# Patient Record
Sex: Female | Born: 1995 | Race: Black or African American | Hispanic: No | Marital: Single | Smoking: Never smoker
Health system: Southern US, Community
[De-identification: ages and names within clinical notes are randomized; demographics above are authoritative.]

## PROBLEM LIST (undated history)

## (undated) DIAGNOSIS — K209 Esophagitis, unspecified without bleeding: Secondary | ICD-10-CM

## (undated) DIAGNOSIS — R51 Headache: Secondary | ICD-10-CM

## (undated) DIAGNOSIS — R030 Elevated blood-pressure reading, without diagnosis of hypertension: Secondary | ICD-10-CM

## (undated) DIAGNOSIS — K449 Diaphragmatic hernia without obstruction or gangrene: Secondary | ICD-10-CM

## (undated) DIAGNOSIS — R519 Headache, unspecified: Secondary | ICD-10-CM

## (undated) DIAGNOSIS — Z6841 Body Mass Index (BMI) 40.0 and over, adult: Secondary | ICD-10-CM

## (undated) HISTORY — DX: Esophagitis, unspecified without bleeding: K20.90

## (undated) HISTORY — DX: Body Mass Index (BMI) 40.0 and over, adult: Z684

## (undated) HISTORY — DX: Headache, unspecified: R51.9

## (undated) HISTORY — DX: Elevated blood-pressure reading, without diagnosis of hypertension: R03.0

## (undated) HISTORY — DX: Diaphragmatic hernia without obstruction or gangrene: K44.9

## (undated) HISTORY — DX: Headache: R51

## (undated) HISTORY — DX: Esophagitis, unspecified: K20.9

## (undated) HISTORY — DX: Morbid (severe) obesity due to excess calories: E66.01

## (undated) HISTORY — PX: NO PAST SURGERIES: SHX2092

---
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
## 2014-01-28 ENCOUNTER — Emergency Department: Payer: Self-pay | Admitting: Emergency Medicine

## 2014-01-31 ENCOUNTER — Encounter (HOSPITAL_COMMUNITY): Payer: Self-pay | Admitting: Emergency Medicine

## 2014-01-31 ENCOUNTER — Emergency Department (HOSPITAL_COMMUNITY)
Admission: EM | Admit: 2014-01-31 | Discharge: 2014-02-01 | Disposition: A | Discharge status: Home / Self Care | Payer: 59 | Attending: Emergency Medicine | Admitting: Emergency Medicine

## 2014-01-31 DIAGNOSIS — M542 Cervicalgia: Secondary | ICD-10-CM | POA: Insufficient documentation

## 2014-01-31 DIAGNOSIS — G4489 Other headache syndrome: Secondary | ICD-10-CM | POA: Insufficient documentation

## 2014-01-31 DIAGNOSIS — R0981 Nasal congestion: Secondary | ICD-10-CM | POA: Diagnosis not present

## 2014-01-31 DIAGNOSIS — R51 Headache: Secondary | ICD-10-CM | POA: Diagnosis present

## 2014-01-31 MED ORDER — PSEUDOEPHEDRINE HCL ER 120 MG PO TB12: 120.0000 mg | ORAL_TABLET | Freq: Two times a day (BID) | ORAL | Status: DC

## 2014-01-31 MED ORDER — CYCLOBENZAPRINE HCL 5 MG PO TABS: 5.0000 mg | ORAL_TABLET | Freq: Three times a day (TID) | ORAL | Status: DC | PRN

## 2014-01-31 NOTE — ED Provider Notes (Signed)
CSN: [id number]     Arrival date & time 01/31/14  2136 History  This chart was scribed for a non-physician practitioner, Earley FavorGail Kimberlin Scheel, FNP, working with Ross Marcusourtney Horton, MD by Julian HyMorgan Graham, ED Scribe. The patient was seen in [facility]. The patient's care was started at 11:23 PM.   Chief Complaint  Patient presents with  . Headache    HPI HPI Comments: Beth SeltzerBrittany Morales is a 18 y.o. female who presents to the Emergency Department complaining of new, constant, moderate and gradually worsening headache onset three weeks ago. She describes her pain as a pulling pain. Pt has associated neck pain, sleep disturbance, nausea and sore throat. Pt states she [facility] and was prescribed fioricet with no relief. Pt notes her pain is worsened with ibuprofen. Pt notes she attempted to take Goody powder that caused fatigue.  She does have seasonal allergies and takes zyrtec. Pt denies recently taking Zyrtec. She has attempted warm compresses on her neck with moderate relief. Pt denies past medical history of headaches or migraines. Pt denies nasal congestion, fever, sore throat, vomiting, or sinus pressure.  History reviewed. No pertinent past medical history. History reviewed. No pertinent past surgical history. History reviewed. No pertinent family history. History  Substance Use Topics  . Smoking status: Never Smoker   . Smokeless tobacco: Never Used  . Alcohol Use: No   OB History    No data available     Review of Systems  Constitutional: Negative for fever and chills.  HENT: Positive for congestion. Negative for sinus pressure and sore throat.   Gastrointestinal: Positive for nausea. Negative for vomiting.  Neurological: Positive for dizziness and headaches.   Allergies  Review of patient's allergies indicates no known allergies.  Home Medications   Prior to Admission medications   Not on File   Triage Vitals: BP 148/88 mmHg  Pulse 94  Temp(Src) 98.2 F (36.8 C)  (Oral)  Resp 20  SpO2 100%  LMP 01/10/2014  Physical Exam  ED Course  Procedures (including critical care time) DIAGNOSTIC STUDIES: Oxygen Saturation is 100% on RA, normal by my interpretation.    COORDINATION OF CARE: 11:26 PM- Patient informed of current plan for treatment and evaluation and agrees with plan at this time.    MDM  I think Ms. Beth Morales has a combination of headaches first being sinus headache, most likely from her seasonal allergies, which she has not taken any medication for a while as she has frontal pressure and sounds nasally when she speaks, and tension-type headache that starts in the back of her neck and radiates up to the back of her scalp.  She's been given prescriptions for ibuprofen, Sudafed, and Flexeril and is suggested that she take Zyrtec on a regular basis, which is now over-the-counter Final diagnoses:  None   I personally performed the services described in this documentation, which was scribed in my presence. The recorded information has been reviewed and is accurate.  Arman FilterGail K Jadore Veals, NP 01/31/14 2350  Donnetta HutchingBrian Cook, MD 02/01/14 [phone]

## 2014-01-31 NOTE — ED Notes (Signed)
Pt arrive to the Ed with a complaint of a headache.  Pt was seen and prescribed medicine but it has been ineffective.  Pt states headache is like a "puulling" pain.  Pt has experienced the headache for three weeks

## 2014-01-31 NOTE — Discharge Instructions (Signed)
Sinus Headache A sinus headache happens when your sinuses become clogged or puffy (swollen). Sinus headaches can be mild or severe. HOME CARE  Take your medicines (antibiotics) as told. Finish them even if you start to feel better.  Only take medicine as told by your doctor.  Use a nose spray if you feel stuffed up (congested). GET HELP RIGHT AWAY IF:  You have a fever.  You have trouble seeing.  You suddenly have pain in your face or head.  You start to twitch or shake (seizure).  You are confused.  You get headaches more than once a week.  Light or sound bothers you.  You feel sick to your stomach (nauseous) or throw up (vomit).  Your headaches do not get better with treatment. MAKE SURE YOU:  Understand these instructions.  Will watch your condition.  Will get help right away if you are not doing well or get worse. Document Released: 07/07/2010 Document Revised: 05/30/2011 Document Reviewed: 07/07/2010 [facility] Patient Information 2015 [employer] [place]. This information is not intended to replace advice given to you by your health care provider. Make sure you discuss any questions you have with your health care provider. I think you have a combination of headaches, one being a sinus headache from your seasonal allergies, please take over-the-counter Zyrtec on a regular basis as well as tension headache U been given a prescription for Flexeril to help with your neck and posterior head pain

## 2015-10-13 IMAGING — CT CT HEAD WITHOUT CONTRAST
1 series · 16 of 29 positions shown, 20 images · non-contrast
Comparison: None.

CLINICAL DATA: Headache for 2 weeks, dizziness.

EXAM:
CT HEAD WITHOUT CONTRAST
TECHNIQUE: Contiguous axial images were obtained from the base of the skull
through the vertex without intravenous contrast.

[Series 2: soft tissue · axial · 0.42mm/px · z∈[-670,-540]mm · 16 of 29 slices shown, 20 images]
[im 2/29  brain]
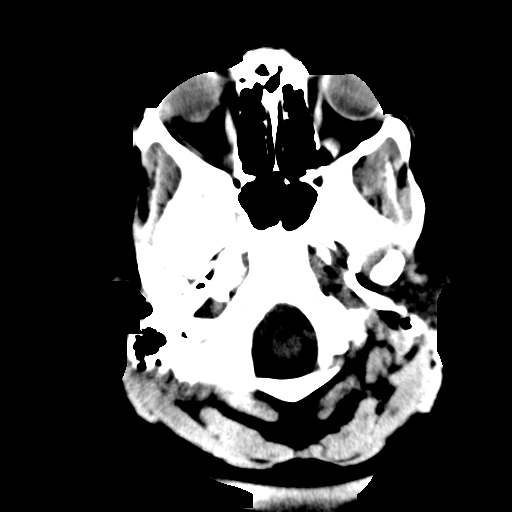
[im 2/29  bone]
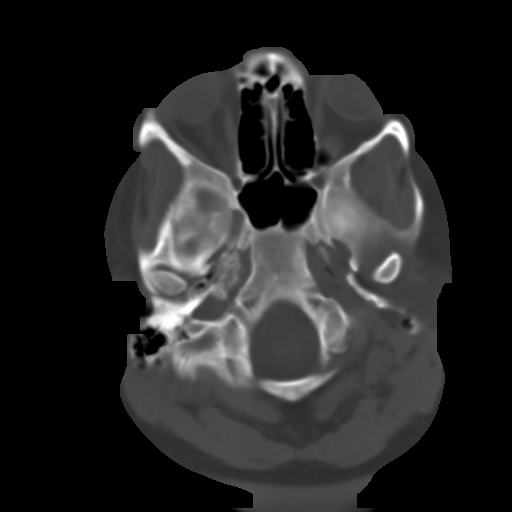
[im 4/29  brain]
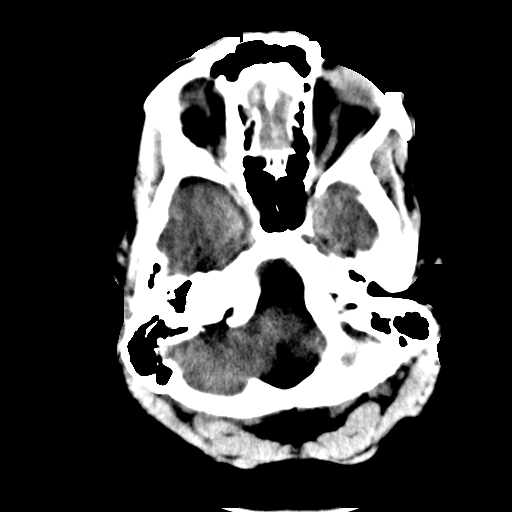
[im 6/29  brain]
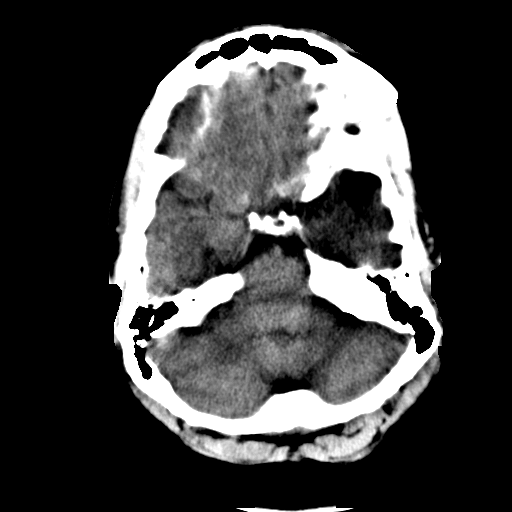
[im 7/29  brain]
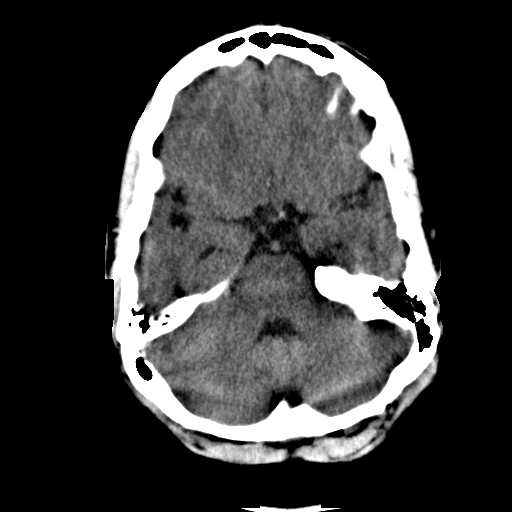
[im 9/29  brain]
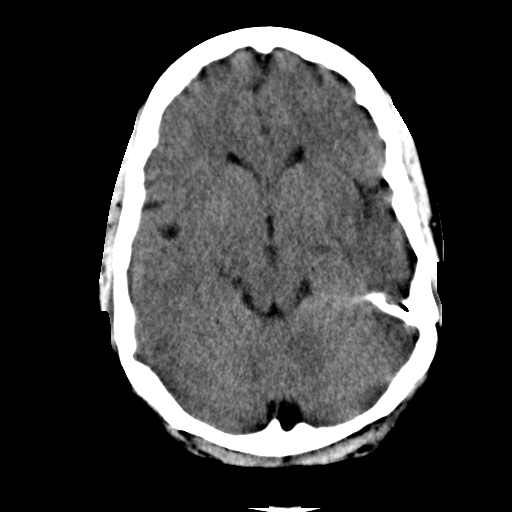
[im 9/29  bone]
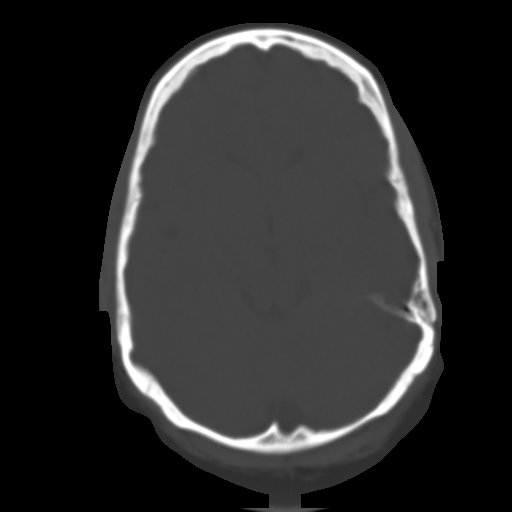
[im 11/29  brain]
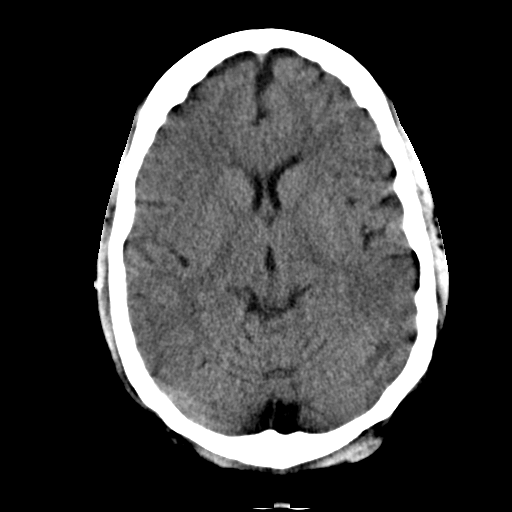
[im 12/29  brain]
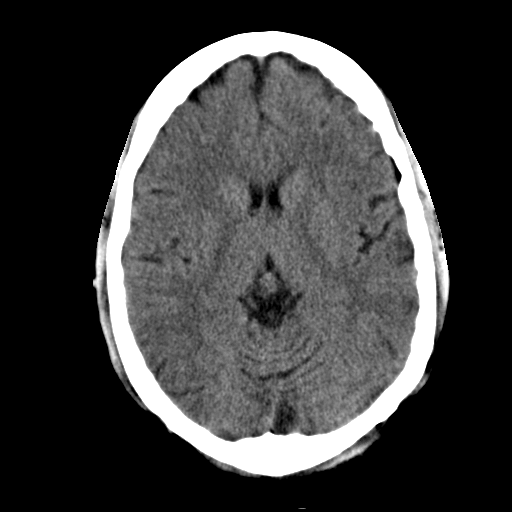
[im 14/29  brain]
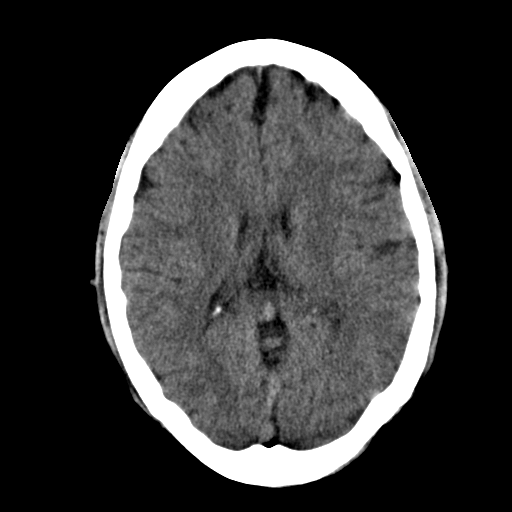
[im 16/29  brain]
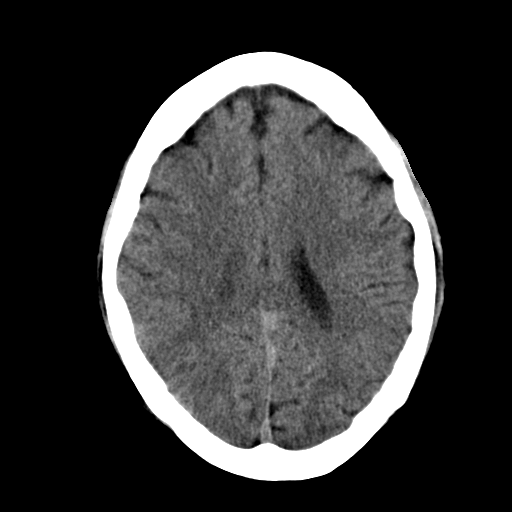
[im 16/29  bone]
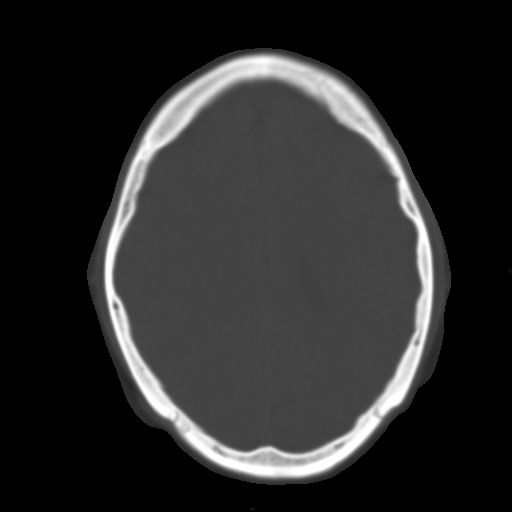
[im 18/29  brain]
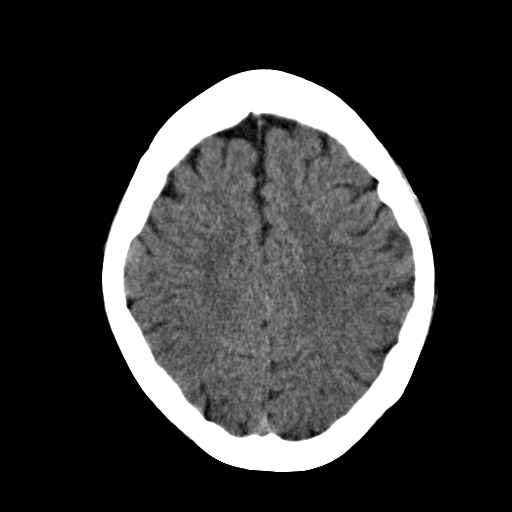
[im 19/29  brain]
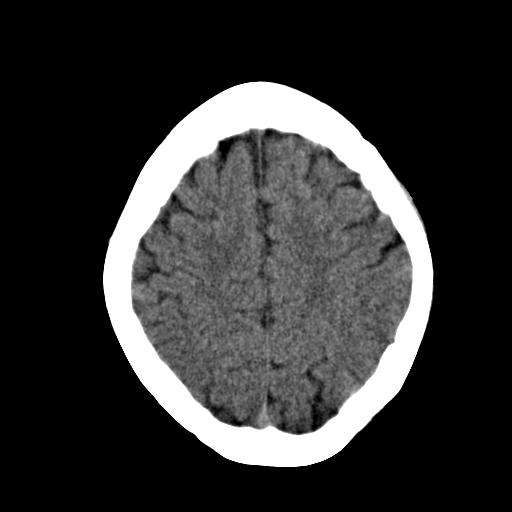
[im 21/29  brain]
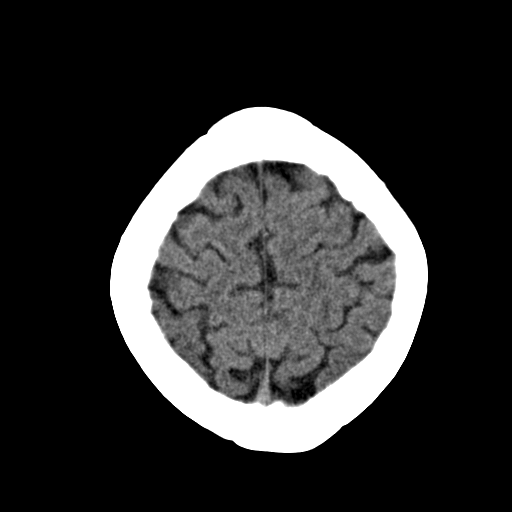
[im 23/29  brain]
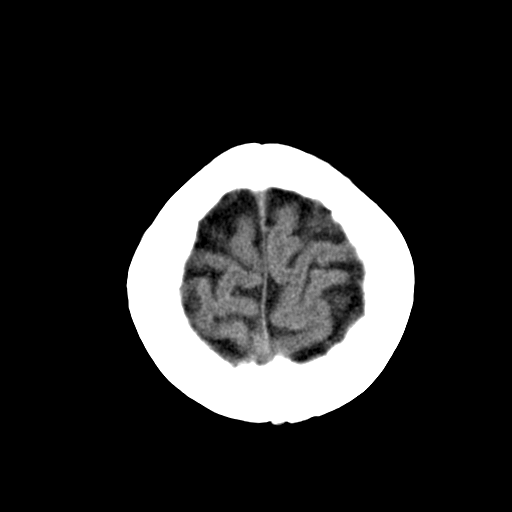
[im 23/29  bone]
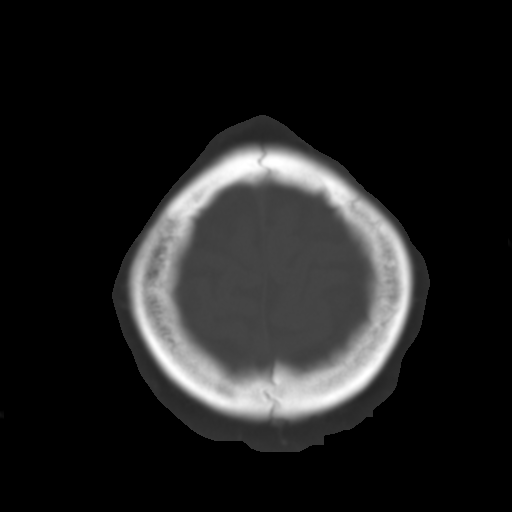
[im 24/29  brain]
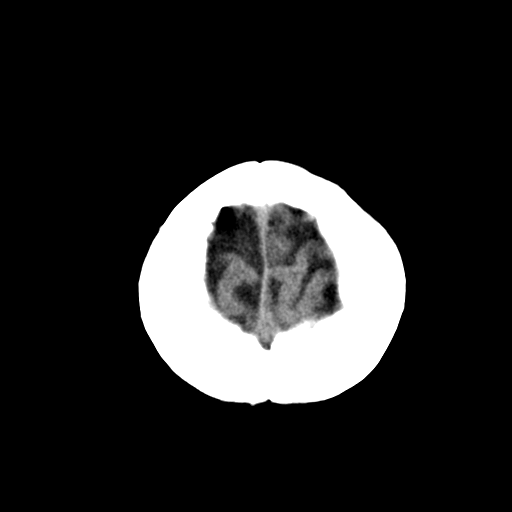
[im 26/29  brain]
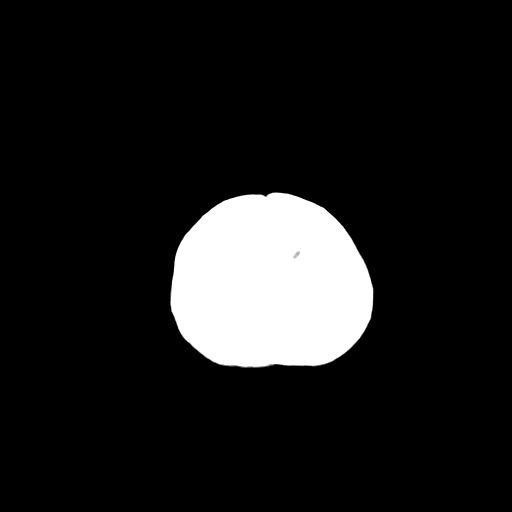
[im 28/29  brain]
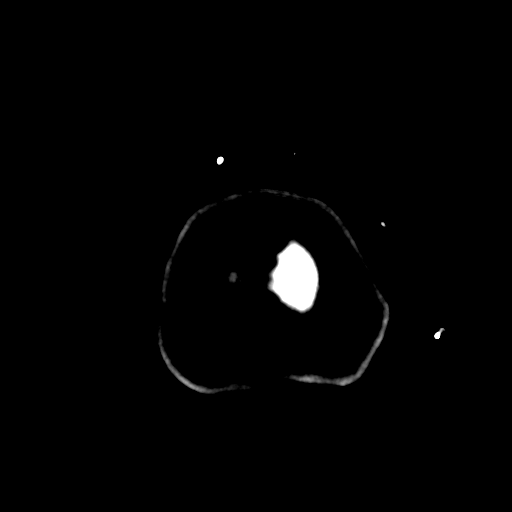

[16 of 29 positions shown; findings below may reference images not displayed]

FINDINGS: No acute intracranial abnormality. Specifically, no hemorrhage,
hydrocephalus, mass lesion, acute infarction, or significant
intracranial injury. No acute calvarial abnormality. Visualized
paranasal sinuses and mastoids clear. Orbital soft tissues
unremarkable.
IMPRESSION: No acute intracranial abnormality.

## 2016-09-07 ENCOUNTER — Ambulatory Visit (INDEPENDENT_AMBULATORY_CARE_PROVIDER_SITE_OTHER): Payer: Managed Care, Other (non HMO) | Admitting: Family Medicine

## 2016-09-07 ENCOUNTER — Encounter: Payer: Self-pay | Admitting: Family Medicine

## 2016-09-07 VITALS — BP 140/88 | HR 86 | Temp 98.5°F | Resp 14 | Ht 66.14 in | Wt 359.2 lb

## 2016-09-07 DIAGNOSIS — Z113 Encounter for screening for infections with a predominantly sexual mode of transmission: Secondary | ICD-10-CM

## 2016-09-07 DIAGNOSIS — Z6841 Body Mass Index (BMI) 40.0 and over, adult: Secondary | ICD-10-CM

## 2016-09-07 DIAGNOSIS — R030 Elevated blood-pressure reading, without diagnosis of hypertension: Secondary | ICD-10-CM | POA: Insufficient documentation

## 2016-09-07 DIAGNOSIS — Z23 Encounter for immunization: Secondary | ICD-10-CM | POA: Diagnosis not present

## 2016-09-07 DIAGNOSIS — Z0000 Encounter for general adult medical examination without abnormal findings: Secondary | ICD-10-CM | POA: Diagnosis not present

## 2016-09-07 NOTE — Assessment & Plan Note (Signed)
Pap up to date. Tdap given today. Screening labs today. Awaiting bariatric surgery. STD screening today (including HIV).

## 2016-09-07 NOTE — Progress Notes (Signed)
Subjective:  Patient ID: Beth Morales, female    DOB: 03/03/1996  Age: 21 y.o. MRN: [id number]  CC: Establish care/physical  HPI Beth Morales is a 21 y.o. female presents to the clinic today to establish care. She is in need of a physical exam.  Preventative Healthcare  Pap smear: Up to date.   Immunizations  Tetanus - In need of.  Labs: Screening labs today.   Alcohol use: Occasional.   Smoking/tobacco use: No.  STD/HIV testing: Desires screening.  PMH, Surgical Hx, Family Hx, Social History reviewed and updated as below.  Past Medical History:  Diagnosis Date  . Elevated BP without diagnosis of hypertension   . Morbid obesity with BMI of 50.0-59.9, adult [facility])    Past Surgical History:  Procedure Laterality Date  . NO PAST SURGERIES     Family History  Problem Relation Age of Onset  . Diabetes Mother   . Alcohol abuse Father   . Diabetes Father   . Breast cancer Maternal Grandmother    Social History  Substance Use Topics  . Smoking status: Never Smoker  . Smokeless tobacco: Never Used  . Alcohol use Yes   Review of Systems  HENT: Positive for tinnitus.   Gastrointestinal: Positive for nausea.  Neurological: Positive for headaches.  All other systems reviewed and are negative.  Objective:   Today's Vitals: BP 140/88 (BP Location: Left Arm, Patient Position: Sitting, Cuff Size: Normal)   Pulse 86   Temp 98.5 F (36.9 C) (Oral)   Resp 14   Ht 5' 6.14" (1.68 m)   Wt (!) 359 lb 3.2 oz (162.9 kg)   LMP 08/02/2016   SpO2 96%   BMI 57.73 kg/m   Physical Exam  Constitutional: She is oriented to person, place, and time. She appears well-developed and well-nourished.  Morbidly obese female in no acute distress.  HENT:  Head: Normocephalic and atraumatic.  Mouth/Throat: Oropharynx is clear and moist.  Normal TMs bilaterally.  Eyes: Conjunctivae are normal. No scleral icterus.  Neck: Neck supple. No thyromegaly present.  Cardiovascular: Normal  rate and regular rhythm.   No murmur heard. Pulmonary/Chest: Effort normal and breath sounds normal. She has no wheezes. She has no rales.  Abdominal: Soft. She exhibits no distension. There is no tenderness. There is no rebound and no guarding.  Lymphadenopathy:    She has no cervical adenopathy.  Neurological: She is alert and oriented to person, place, and time.  No focal deficits.  Skin: Skin is warm. No rash noted.  Psychiatric: She has a normal mood and affect.  Vitals reviewed.  Assessment & Plan:   Problem List Items Addressed This Visit      Other   Annual physical exam - Primary    Pap up to date. Tdap given today. Screening labs today. Awaiting bariatric surgery. STD screening today (including HIV).      Relevant Orders   CBC   HgB A1c   Comp Met (CMET)   Lipid panel   TSH   HIV antibody   RPR   Morbid obesity with BMI of 50.0-59.9, adult ([place])    Other Visit Diagnoses    Screening for STD (sexually transmitted disease)       Relevant Orders   Urine cytology ancillary only   HIV antibody   GC/Chlamydia Probe Amp   Need for Tdap vaccination         Follow-up: Annually  [place]

## 2016-09-07 NOTE — Progress Notes (Signed)
Pre-visit discussion using our clinic review tool. No additional management support is needed unless otherwise documented below in the visit note.  

## 2016-09-07 NOTE — Patient Instructions (Signed)
We will call with your lab results.  Follow up annually  Take care  Dr. Lacinda Axon   Health Maintenance, Female Adopting a healthy lifestyle and getting preventive care can go a long way to promote health and wellness. Talk with your health care provider about what schedule of regular examinations is right for you. This is a good chance for you to check in with your provider about disease prevention and staying healthy. In between checkups, there are plenty of things you can do on your own. Experts have done a lot of research about which lifestyle changes and preventive measures are most likely to keep you healthy. Ask your health care provider for more information. Weight and diet Eat a healthy diet  Be sure to include plenty of vegetables, fruits, low-fat dairy products, and lean protein.  Do not eat a lot of foods high in solid fats, added sugars, or salt.  Get regular exercise. This is one of the most important things you can do for your health. ? Most adults should exercise for at least 150 minutes each week. The exercise should increase your heart rate and make you sweat (moderate-intensity exercise). ? Most adults should also do strengthening exercises at least twice a week. This is in addition to the moderate-intensity exercise.  Maintain a healthy weight  Body mass index (BMI) is a measurement that can be used to identify possible weight problems. It estimates body fat based on height and weight. Your health care provider can help determine your BMI and help you achieve or maintain a healthy weight.  For females 50 years of age and older: ? A BMI below 18.5 is considered underweight. ? A BMI of 18.5 to 24.9 is normal. ? A BMI of 25 to 29.9 is considered overweight. ? A BMI of 30 and above is considered obese.  Watch levels of cholesterol and blood lipids  You should start having your blood tested for lipids and cholesterol at 23 years of age, then have this test every 5  years.  You may need to have your cholesterol levels checked more often if: ? Your lipid or cholesterol levels are high. ? You are older than 24 years of age. ? You are at high risk for heart disease.  Cancer screening Lung Cancer  Lung cancer screening is recommended for adults 45-72 years old who are at high risk for lung cancer because of a history of smoking.  A yearly low-dose CT scan of the lungs is recommended for people who: ? Currently smoke. ? Have quit within the past 15 years. ? Have at least a 30-pack-year history of smoking. A pack year is smoking an average of one pack of cigarettes a day for 1 year.  Yearly screening should continue until it has been 15 years since you quit.  Yearly screening should stop if you develop a health problem that would prevent you from having lung cancer treatment.  Breast Cancer  Practice breast self-awareness. This means understanding how your breasts normally appear and feel.  It also means doing regular breast self-exams. Let your health care provider know about any changes, no matter how small.  If you are in your 20s or 30s, you should have a clinical breast exam (CBE) by a health care provider every 1-3 years as part of a regular health exam.  If you are 48 or older, have a CBE every year. Also consider having a breast X-ray (mammogram) every year.  If you have a family history  of breast cancer, talk to your health care provider about genetic screening.  If you are at high risk for breast cancer, talk to your health care provider about having an MRI and a mammogram every year.  Breast cancer gene (BRCA) assessment is recommended for women who have family members with BRCA-related cancers. BRCA-related cancers include: ? Breast. ? Ovarian. ? Tubal. ? Peritoneal cancers.  Results of the assessment will determine the need for genetic counseling and BRCA1 and BRCA2 testing.  Cervical Cancer Your health care provider may  recommend that you be screened regularly for cancer of the pelvic organs (ovaries, uterus, and vagina). This screening involves a pelvic examination, including checking for microscopic changes to the surface of your cervix (Pap test). You may be encouraged to have this screening done every 3 years, beginning at age 5.  For women ages 49-65, health care providers may recommend pelvic exams and Pap testing every 3 years, or they may recommend the Pap and pelvic exam, combined with testing for human papilloma virus (HPV), every 5 years. Some types of HPV increase your risk of cervical cancer. Testing for HPV may also be done on women of any age with unclear Pap test results.  Other health care providers may not recommend any screening for nonpregnant women who are considered low risk for pelvic cancer and who do not have symptoms. Ask your health care provider if a screening pelvic exam is right for you.  If you have had past treatment for cervical cancer or a condition that could lead to cancer, you need Pap tests and screening for cancer for at least 20 years after your treatment. If Pap tests have been discontinued, your risk factors (such as having a new sexual partner) need to be reassessed to determine if screening should resume. Some women have medical problems that increase the chance of getting cervical cancer. In these cases, your health care provider may recommend more frequent screening and Pap tests.  Colorectal Cancer  This type of cancer can be detected and often prevented.  Routine colorectal cancer screening usually begins at 23 years of age and continues through 23 years of age.  Your health care provider may recommend screening at an earlier age if you have risk factors for colon cancer.  Your health care provider may also recommend using home test kits to check for hidden blood in the stool.  A small camera at the end of a tube can be used to examine your colon directly  (sigmoidoscopy or colonoscopy). This is done to check for the earliest forms of colorectal cancer.  Routine screening usually begins at age 1.  Direct examination of the colon should be repeated every 5-10 years through 23 years of age. However, you may need to be screened more often if early forms of precancerous polyps or small growths are found.  Skin Cancer  Check your skin from head to toe regularly.  Tell your health care provider about any new moles or changes in moles, especially if there is a change in a mole's shape or color.  Also tell your health care provider if you have a mole that is larger than the size of a pencil eraser.  Always use sunscreen. Apply sunscreen liberally and repeatedly throughout the day.  Protect yourself by wearing long sleeves, pants, a wide-brimmed hat, and sunglasses whenever you are outside.  Heart disease, diabetes, and high blood pressure  High blood pressure causes heart disease and increases the risk of stroke. High  blood pressure is more likely to develop in: ? People who have blood pressure in the high end of the normal range (130-139/85-89 mm Hg). ? People who are overweight or obese. ? People who are African American.  If you are 18-39 years of age, have your blood pressure checked every 3-5 years. If you are 40 years of age or older, have your blood pressure checked every year. You should have your blood pressure measured twice-once when you are at a hospital or clinic, and once when you are not at a hospital or clinic. Record the average of the two measurements. To check your blood pressure when you are not at a hospital or clinic, you can use: ? An automated blood pressure machine at a pharmacy. ? A home blood pressure monitor.  If you are between 55 years and 79 years old, ask your health care provider if you should take aspirin to prevent strokes.  Have regular diabetes screenings. This involves taking a blood sample to check your  fasting blood sugar level. ? If you are at a normal weight and have a low risk for diabetes, have this test once every three years after 23 years of age. ? If you are overweight and have a high risk for diabetes, consider being tested at a younger age or more often. Preventing infection Hepatitis B  If you have a higher risk for hepatitis B, you should be screened for this virus. You are considered at high risk for hepatitis B if: ? You were born in a country where hepatitis B is common. Ask your health care provider which countries are considered high risk. ? Your parents were born in a high-risk country, and you have not been immunized against hepatitis B (hepatitis B vaccine). ? You have HIV or AIDS. ? You use needles to inject street drugs. ? You live with someone who has hepatitis B. ? You have had sex with someone who has hepatitis B. ? You get hemodialysis treatment. ? You take certain medicines for conditions, including cancer, organ transplantation, and autoimmune conditions.  Hepatitis C  Blood testing is recommended for: ? Everyone born from 1945 through 1965. ? Anyone with known risk factors for hepatitis C.  Sexually transmitted infections (STIs)  You should be screened for sexually transmitted infections (STIs) including gonorrhea and chlamydia if: ? You are sexually active and are younger than 24 years of age. ? You are older than 24 years of age and your health care provider tells you that you are at risk for this type of infection. ? Your sexual activity has changed since you were last screened and you are at an increased risk for chlamydia or gonorrhea. Ask your health care provider if you are at risk.  If you do not have HIV, but are at risk, it may be recommended that you take a prescription medicine daily to prevent HIV infection. This is called pre-exposure prophylaxis (PrEP). You are considered at risk if: ? You are sexually active and do not regularly use condoms  or know the HIV status of your partner(s). ? You take drugs by injection. ? You are sexually active with a partner who has HIV.  Talk with your health care provider about whether you are at high risk of being infected with HIV. If you choose to begin PrEP, you should first be tested for HIV. You should then be tested every 3 months for as long as you are taking PrEP. Pregnancy  If you are   premenopausal and you may become pregnant, ask your health care provider about preconception counseling.  If you may become pregnant, take 400 to 800 micrograms (mcg) of folic acid every day.  If you want to prevent pregnancy, talk to your health care provider about birth control (contraception). Osteoporosis and menopause  Osteoporosis is a disease in which the bones lose minerals and strength with aging. This can result in serious bone fractures. Your risk for osteoporosis can be identified using a bone density scan.  If you are 41 years of age or older, or if you are at risk for osteoporosis and fractures, ask your health care provider if you should be screened.  Ask your health care provider whether you should take a calcium or vitamin D supplement to lower your risk for osteoporosis.  Menopause may have certain physical symptoms and risks.  Hormone replacement therapy may reduce some of these symptoms and risks. Talk to your health care provider about whether hormone replacement therapy is right for you. Follow these instructions at home:  Schedule regular health, dental, and eye exams.  Stay current with your immunizations.  Do not use any tobacco products including cigarettes, chewing tobacco, or electronic cigarettes.  If you are pregnant, do not drink alcohol.  If you are breastfeeding, limit how much and how often you drink alcohol.  Limit alcohol intake to no more than 1 drink per day for nonpregnant women. One drink equals 12 ounces of beer, 5 ounces of wine, or 1 ounces of hard  liquor.  Do not use street drugs.  Do not share needles.  Ask your health care provider for help if you need support or information about quitting drugs.  Tell your health care provider if you often feel depressed.  Tell your health care provider if you have ever been abused or do not feel safe at home. This information is not intended to replace advice given to you by your health care provider. Make sure you discuss any questions you have with your health care provider. Document Released: 09/20/2010 Document Revised: 08/13/2015 Document Reviewed: 12/09/2014 Elsevier Interactive Patient Education  [employer].

## 2016-09-08 LAB — COMPREHENSIVE METABOLIC PANEL
A/G RATIO: 1.1 — AB (ref 1.2–2.2)
ALT: 17 IU/L (ref 0–32)
AST: 18 IU/L (ref 0–40)
Albumin: 3.7 g/dL (ref 3.5–5.5)
Alkaline Phosphatase: 75 IU/L (ref 39–117)
BUN/Creatinine Ratio: 16 (ref 9–23)
BUN: 10 mg/dL (ref 6–20)
Bilirubin Total: 0.4 mg/dL (ref 0.0–1.2)
CALCIUM: 9.2 mg/dL (ref 8.7–10.2)
CO2: 21 mmol/L (ref 20–29)
CREATININE: 0.63 mg/dL (ref 0.57–1.00)
Chloride: 103 mmol/L (ref 96–106)
GFR calc Af Amer: 146 mL/min/{1.73_m2} (ref >59)
GFR, EST NON AFRICAN AMERICAN: 127 mL/min/{1.73_m2} (ref >59)
Globulin, Total: 3.4 g/dL (ref 1.5–4.5)
Glucose: 127 mg/dL — ABNORMAL HIGH (ref 65–99)
POTASSIUM: 4.3 mmol/L (ref 3.5–5.2)
Sodium: 137 mmol/L (ref 134–144)
Total Protein: 7.1 g/dL (ref 6.0–8.5)

## 2016-09-08 LAB — LIPID PANEL
CHOL/HDL RATIO: 4.4 ratio (ref 0.0–4.4)
Cholesterol, Total: 216 mg/dL — ABNORMAL HIGH (ref 100–199)
HDL: 49 mg/dL (ref >39)
LDL Calculated: 144 mg/dL — ABNORMAL HIGH (ref 0–99)
TRIGLYCERIDES: 113 mg/dL (ref 0–149)
VLDL CHOLESTEROL CAL: 23 mg/dL (ref 5–40)

## 2016-09-08 LAB — CBC
HEMOGLOBIN: 13 g/dL (ref 11.1–15.9)
Hematocrit: 39.3 % (ref 34.0–46.6)
MCH: 31 pg (ref 26.6–33.0)
MCHC: 33.1 g/dL (ref 31.5–35.7)
MCV: 94 fL (ref 79–97)
Platelets: 453 10*3/uL — ABNORMAL HIGH (ref 150–379)
RBC: 4.19 x10E6/uL (ref 3.77–5.28)
RDW: 13.2 % (ref 12.3–15.4)
WBC: 5.7 10*3/uL (ref 3.4–10.8)

## 2016-09-08 LAB — HEMOGLOBIN A1C
ESTIMATED AVERAGE GLUCOSE: 123 mg/dL
Hgb A1c MFr Bld: 5.9 % — ABNORMAL HIGH (ref 4.8–5.6)

## 2016-09-08 LAB — RPR: RPR Ser Ql: NONREACTIVE

## 2016-09-08 LAB — TSH: TSH: 1.4 u[IU]/mL (ref 0.450–4.500)

## 2016-09-08 LAB — HIV ANTIBODY (ROUTINE TESTING W REFLEX): HIV Screen 4th Generation wRfx: NONREACTIVE

## 2016-09-09 ENCOUNTER — Other Ambulatory Visit: Payer: Self-pay | Admitting: Family Medicine

## 2016-09-09 LAB — GC/CHLAMYDIA PROBE AMP
CHLAMYDIA, DNA PROBE: POSITIVE — AB
Neisseria gonorrhoeae by PCR: NEGATIVE

## 2016-09-09 MED ORDER — AZITHROMYCIN 250 MG PO TABS: ORAL_TABLET | ORAL | 0 refills | Status: DC

## 2016-10-10 ENCOUNTER — Ambulatory Visit (INDEPENDENT_AMBULATORY_CARE_PROVIDER_SITE_OTHER): Payer: Managed Care, Other (non HMO) | Admitting: Family Medicine

## 2016-10-10 VITALS — BP 118/84 | HR 52 | Temp 98.5°F | Wt 354.4 lb

## 2016-10-10 DIAGNOSIS — F419 Anxiety disorder, unspecified: Secondary | ICD-10-CM | POA: Diagnosis not present

## 2016-10-10 NOTE — Progress Notes (Signed)
   Subjective:  Patient ID: Beth SeltzerBrittany Mauro, female    DOB: 02/05/1996  Age: 21 y.o. MRN: [id number]  CC: Anxiety  HPI:  21 year old female presents with the above complaint.  Patient reports that over the past year she's had trouble with what she feels is anxiety. She states that she is constantly overthinking and questioning herself. She states that she has racing thoughts at night. Interferes with sleep. She's quite perseverative about certain things. She states this started to affect her work. Worsening as of late. No known relieving factors. Seems to be exacerbated by work stress. No hallucinations or thoughts of suicide. No other associated symptoms. No other complaints or concerns at this time.  Social Hx   Social History   Social History  . Marital status: Single    Spouse name: N/A  . Number of children: N/A  . Years of education: N/A   Social History Main Topics  . Smoking status: Never Smoker  . Smokeless tobacco: Never Used  . Alcohol use Yes  . Drug use: No  . Sexual activity: Yes   Other Topics Concern  . Not on file   Social History Narrative  . No narrative on file    Review of Systems  Constitutional: Negative.   Psychiatric/Behavioral: Positive for sleep disturbance. The patient is nervous/anxious.    Objective:  BP 118/84 (BP Location: Left Arm, Patient Position: Sitting, Cuff Size: Large)   Pulse (!) 52   Temp 98.5 F (36.9 C) (Oral)   Wt (!) 354 lb 6 oz (160.7 kg)   LMP 08/31/2016   BMI 56.95 kg/m   BP/Weight 10/10/2016 09/07/2016 02/01/2014  Systolic BP 118 140 138  Diastolic BP 84 88 86  Wt. (Lbs) 354.38 359.2 -  BMI 56.95 57.73 -    Physical Exam  Constitutional: She is oriented to person, place, and time. She appears well-developed.  HENT:  Head: Normocephalic and atraumatic.  Eyes: Conjunctivae are normal. No scleral icterus.  Pulmonary/Chest: Effort normal. No respiratory distress.  Neurological: She is alert and oriented to person,  place, and time.  Psychiatric: She has a normal mood and affect. Her behavior is normal. Thought content normal.  Vitals reviewed.   Lab Results  Component Value Date   WBC 5.7 09/07/2016   HGB 13.0 09/07/2016   HCT 39.3 09/07/2016   PLT 453 (H) 09/07/2016   GLUCOSE 127 (H) 09/07/2016   CHOL 216 (H) 09/07/2016   TRIG 113 09/07/2016   HDL 49 09/07/2016   LDLCALC 144 (H) 09/07/2016   ALT 17 09/07/2016   AST 18 09/07/2016   NA 137 09/07/2016   K 4.3 09/07/2016   CL 103 09/07/2016   CREATININE 0.63 09/07/2016   BUN 10 09/07/2016   CO2 21 09/07/2016   TSH 1.400 09/07/2016   HGBA1C 5.9 (H) 09/07/2016    Assessment & Plan:   Problem List Items Addressed This Visit      Other   Anxiety - Primary    New problem. I feel that she needs to work through some of her stressors and issues at work. I think she would be best served by seeing a psychologist for counseling as opposed to pharmacotherapy at this time. Patient in agreement. Placing referral.      Relevant Orders   Ambulatory referral to Psychology     Follow-up: PRN  Everlene OtherJayce Marlita Keil DO [facility] [place]

## 2016-10-10 NOTE — Assessment & Plan Note (Signed)
New problem. I feel that she needs to work through some of her stressors and issues at work. I think she would be best served by seeing a psychologist for counseling as opposed to pharmacotherapy at this time. Patient in agreement. Placing referral.

## 2016-10-10 NOTE — Patient Instructions (Signed)
We will call with the appt.  Take care  Dr. Adriana Simasook    Living With Anxiety After being diagnosed with an anxiety disorder, you may be relieved to know why you have felt or behaved a certain way. It is natural to also feel overwhelmed about the treatment ahead and what it will mean for your life. With care and support, you can manage this condition and recover from it. How to cope with anxiety Dealing with stress Stress is your body's reaction to life changes and events, both good and bad. Stress can last just a few hours or it can be ongoing. Stress can play a major role in anxiety, so it is important to learn both how to cope with stress and how to think about it differently. Talk with your health care provider or a counselor to learn more about stress reduction. He or she may suggest some stress reduction techniques, such as:  Music therapy. This can include creating or listening to music that you enjoy and that inspires you.  Mindfulness-based meditation. This involves being aware of your normal breaths, rather than trying to control your breathing. It can be done while sitting or walking.  Centering prayer. This is a kind of meditation that involves focusing on a word, phrase, or sacred image that is meaningful to you and that brings you peace.  Deep breathing. To do this, expand your stomach and inhale slowly through your nose. Hold your breath for 3-5 seconds. Then exhale slowly, allowing your stomach muscles to relax.  Self-talk. This is a skill where you identify thought patterns that lead to anxiety reactions and correct those thoughts.  Muscle relaxation. This involves tensing muscles then relaxing them.  Choose a stress reduction technique that fits your lifestyle and personality. Stress reduction techniques take time and practice. Set aside 5-15 minutes a day to do them. Therapists can offer training in these techniques. The training may be covered by some insurance plans. Other  things you can do to manage stress include:  Keeping a stress diary. This can help you learn what triggers your stress and ways to control your response.  Thinking about how you respond to certain situations. You may not be able to control everything, but you can control your reaction.  Making time for activities that help you relax, and not feeling guilty about spending your time in this way.  Therapy combined with coping and stress-reduction skills provides the best chance for successful treatment. Medicines Medicines can help ease symptoms. Medicines for anxiety include:  Anti-anxiety drugs.  Antidepressants.  Beta-blockers.  Medicines may be used as the main treatment for anxiety disorder, along with therapy, or if other treatments are not working. Medicines should be prescribed by a health care provider. Relationships Relationships can play a big part in helping you recover. Try to spend more time connecting with trusted friends and family members. Consider going to couples counseling, taking family education classes, or going to family therapy. Therapy can help you and others better understand the condition. How to recognize changes in your condition Everyone has a different response to treatment for anxiety. Recovery from anxiety happens when symptoms decrease and stop interfering with your daily activities at home or work. This may mean that you will start to:  Have better concentration and focus.  Sleep better.  Be less irritable.  Have more energy.  Have improved memory.  It is important to recognize when your condition is getting worse. Contact your health care provider if  your symptoms interfere with home or work and you do not feel like your condition is improving. Where to find help and support: You can get help and support from these sources:  Self-help groups.  Online and [employer].  A trusted spiritual leader.  Couples counseling.  Family  education classes.  Family therapy.  Follow these instructions at home:  Eat a healthy diet that includes plenty of vegetables, fruits, whole grains, low-fat dairy products, and lean protein. Do not eat a lot of foods that are high in solid fats, added sugars, or salt.  Exercise. Most adults should do the following: ? Exercise for at least 150 minutes each week. The exercise should increase your heart rate and make you sweat (moderate-intensity exercise). ? Strengthening exercises at least twice a week.  Cut down on caffeine, tobacco, alcohol, and other potentially harmful substances.  Get the right amount and quality of sleep. Most adults need 7-9 hours of sleep each night.  Make choices that simplify your life.  Take over-the-counter and prescription medicines only as told by your health care provider.  Avoid caffeine, alcohol, and certain over-the-counter cold medicines. These may make you feel worse. Ask your pharmacist which medicines to avoid.  Keep all follow-up visits as told by your health care provider. This is important. Questions to ask your health care provider  Would I benefit from therapy?  How often should I follow up with a health care provider?  How long do I need to take medicine?  Are there any long-term side effects of my medicine?  Are there any alternatives to taking medicine? Contact a health care provider if:  You have a hard time staying focused or finishing daily tasks.  You spend many hours a day feeling worried about everyday life.  You become exhausted by worry.  You start to have headaches, feel tense, or have nausea.  You urinate more than normal.  You have diarrhea. Get help right away if:  You have a racing heart and shortness of breath.  You have thoughts of hurting yourself or others. If you ever feel like you may hurt yourself or others, or have thoughts about taking your own life, get help right away. You can go to your nearest  emergency department or call:  Your local emergency services (911 in the [place]  A suicide crisis helpline, such as the National Suicide Prevention Lifeline at (224)460-6411. This is open 24-hours a day.  Summary  Taking steps to deal with stress can help calm you.  Medicines cannot cure anxiety disorders, but they can help ease symptoms.  Family, friends, and partners can play a big part in helping you recover from an anxiety disorder. This information is not intended to replace advice given to you by your health care provider. Make sure you discuss any questions you have with your health care provider. Document Released: 03/01/2016 Document Revised: 03/01/2016 Document Reviewed: 03/01/2016 Elsevier Interactive Patient Education  [employer].

## 2016-11-10 ENCOUNTER — Ambulatory Visit: Payer: Managed Care, Other (non HMO) | Admitting: Family Medicine

## 2016-12-06 ENCOUNTER — Ambulatory Visit (INDEPENDENT_AMBULATORY_CARE_PROVIDER_SITE_OTHER): Payer: Managed Care, Other (non HMO) | Admitting: Psychology

## 2016-12-06 DIAGNOSIS — F509 Eating disorder, unspecified: Secondary | ICD-10-CM

## 2016-12-06 DIAGNOSIS — F4322 Adjustment disorder with anxiety: Secondary | ICD-10-CM | POA: Diagnosis not present

## 2016-12-22 ENCOUNTER — Ambulatory Visit: Payer: Self-pay | Admitting: Psychology

## 2017-01-03 ENCOUNTER — Ambulatory Visit: Payer: Self-pay | Admitting: Psychology

## 2017-01-16 ENCOUNTER — Ambulatory Visit: Payer: Self-pay | Admitting: Psychology

## 2017-03-07 ENCOUNTER — Encounter: Payer: Self-pay | Admitting: Obstetrics and Gynecology

## 2017-03-07 ENCOUNTER — Ambulatory Visit (INDEPENDENT_AMBULATORY_CARE_PROVIDER_SITE_OTHER): Payer: Managed Care, Other (non HMO) | Admitting: Obstetrics and Gynecology

## 2017-03-07 ENCOUNTER — Telehealth: Payer: Self-pay

## 2017-03-07 VITALS — BP 130/90 | HR 79 | Ht 69.0 in | Wt 354.0 lb

## 2017-03-07 DIAGNOSIS — A749 Chlamydial infection, unspecified: Secondary | ICD-10-CM

## 2017-03-07 DIAGNOSIS — Z113 Encounter for screening for infections with a predominantly sexual mode of transmission: Secondary | ICD-10-CM

## 2017-03-07 DIAGNOSIS — N938 Other specified abnormal uterine and vaginal bleeding: Secondary | ICD-10-CM | POA: Diagnosis not present

## 2017-03-07 LAB — POCT URINE PREGNANCY: Preg Test, Ur: NEGATIVE

## 2017-03-07 MED ORDER — MEDROXYPROGESTERONE ACETATE 10 MG PO TABS: 10.0000 mg | ORAL_TABLET | Freq: Every day | ORAL | 0 refills | Status: DC

## 2017-03-07 NOTE — Progress Notes (Signed)
Chief Complaint  Patient presents with  . Vaginal Bleeding    been on period for 18 days& last month no period    HPI:      Ms. Beth Morales is a 21 y.o. G0P0000 who LMP was Patient's last menstrual period was 02/18/2017., presents today for irregular menses/DUB sx this month. Pt usually has monthly menses, lasting 4 days, light to med flow, with mild dysmen. No BTB. Pt skipped 11/18 menses and started bleeding 02/18/17. Bleeding never stopped and is heavier than usual, but still med flow. No clots. Still has dsymen, no meds needed. No vag sx, fevers, pelvic pain.  She is sex active, occas uses condoms. Declines BC.  Hx of Chlamydia 6/18 with PCP. She and partner treated and pt had neg TOC.  Had annual 6/18 with PCP.   Past Medical History:  Diagnosis Date  . Elevated BP without diagnosis of hypertension   . Morbid obesity with BMI of 50.0-59.9, adult [facility](HCC)     Past Surgical History:  Procedure Laterality Date  . NO PAST SURGERIES      Family History  Problem Relation Age of Onset  . Diabetes Mother   . Alcohol abuse Father   . Diabetes Father   . Breast cancer Maternal Grandmother     Social History   Socioeconomic History  . Marital status: Single    Spouse name: Not on file  . Number of children: Not on file  . Years of education: Not on file  . Highest education level: Not on file  Social Needs  . Financial resource strain: Not on file  . Food insecurity - worry: Not on file  . Food insecurity - inability: Not on file  . Transportation needs - medical: Not on file  . Transportation needs - non-medical: Not on file  Occupational History  . Not on file  Tobacco Use  . Smoking status: Never Smoker  . Smokeless tobacco: Never Used  Substance and Sexual Activity  . Alcohol use: Yes  . Drug use: No  . Sexual activity: Yes    Birth control/protection: None  Other Topics Concern  . Not on file  Social History Narrative  . Not on file     Current  Outpatient Medications:  .  medroxyPROGESTERone (PROVERA) 10 MG tablet, Take 1 tablet (10 mg total) by mouth daily for 7 days., Disp: 7 tablet, Rfl: 0   ROS:  Review of Systems  Constitutional: Negative for fever.  Gastrointestinal: Negative for blood in stool, constipation, diarrhea, nausea and vomiting.  Genitourinary: Positive for menstrual problem. Negative for dyspareunia, dysuria, flank pain, frequency, hematuria, urgency, vaginal bleeding, vaginal discharge and vaginal pain.  Musculoskeletal: Negative for back pain.  Skin: Negative for rash.     OBJECTIVE:   Vitals:  BP 130/90   Pulse 79   Ht 5\' 9"  (1.753 m)   Wt (!) 354 lb (160.6 kg)   LMP 02/18/2017   BMI 52.28 kg/m   Physical Exam  Constitutional: She is oriented to person, place, and time and well-developed, well-nourished, and in no distress. Vital signs are normal.  Genitourinary: Uterus normal, cervix normal, right adnexa normal, left adnexa normal and vulva normal. Uterus is not enlarged. Cervix exhibits no motion tenderness and no tenderness. Right adnexum displays no mass and no tenderness. Left adnexum displays no mass and no tenderness. Vulva exhibits no erythema, no exudate, no lesion, no rash and no tenderness. Vagina exhibits no lesion. Bloody and vaginal discharge found.  Neurological: She is oriented to person, place, and time.  Vitals reviewed.   Results: Results for orders placed or performed in visit on 03/07/17 (from the past 24 hour(s))  POCT urine pregnancy     Status: Normal   Collection Time: 03/07/17  9:57 AM  Result Value Ref Range   Preg Test, Ur Negative Negative     Assessment/Plan: DUB (dysfunctional uterine bleeding) - Neg UPT. Check nuswab. Rx provera. RTO if sx persist for further eval.  - Plan: POCT urine pregnancy, Chlamydia/Gonococcus/Trichomonas, NAA, medroxyPROGESTERone (PROVERA) 10 MG tablet  Screening for STD (sexually transmitted disease) - Plan:  Chlamydia/Gonococcus/Trichomonas, NAA  Chlamydia - 6/18 with neg TOC per pt report.    Meds ordered this encounter  Medications  . medroxyPROGESTERone (PROVERA) 10 MG tablet    Sig: Take 1 tablet (10 mg total) by mouth daily for 7 days.    Dispense:  7 tablet    Refill:  0      Return if symptoms worsen or fail to improve.  Green Quincy B. Maleaha Hughett, PA-C 03/07/2017 9:59 AM

## 2017-03-07 NOTE — Patient Instructions (Signed)
I value your feedback and entrusting us with your care. If you get a [facility] patient survey, I would appreciate you taking the time to let us know about your experience today. Thank you! 

## 2017-03-07 NOTE — Telephone Encounter (Signed)
Copied from CRM [id number]. Topic: General - Other >> Mar 07, 2017  1:55 PM Debroah LoopLander, Lumin L wrote: Reason for CRM: French Anaracy calling from [facility] says she faxed on 11/26 a Bariatric Medical Clearance and a 5 year weight history for the patient but has not received anything back. The patient's [employer] insurance requires the documentation to move forward with treatment. Refaxing now and would like a call to confirm receipt.

## 2017-03-09 LAB — CHLAMYDIA/GONOCOCCUS/TRICHOMONAS, NAA
Chlamydia by NAA: NEGATIVE
GONOCOCCUS BY NAA: NEGATIVE
Trich vag by NAA: NEGATIVE

## 2017-04-07 ENCOUNTER — Ambulatory Visit (INDEPENDENT_AMBULATORY_CARE_PROVIDER_SITE_OTHER): Payer: Managed Care, Other (non HMO) | Admitting: Internal Medicine

## 2017-04-07 ENCOUNTER — Encounter: Payer: Self-pay | Admitting: Internal Medicine

## 2017-04-07 VITALS — BP 118/78 | HR 98 | Temp 98.2°F | Resp 16 | Ht 67.0 in | Wt 354.5 lb

## 2017-04-07 DIAGNOSIS — R7303 Prediabetes: Secondary | ICD-10-CM

## 2017-04-07 DIAGNOSIS — R51 Headache: Secondary | ICD-10-CM

## 2017-04-07 DIAGNOSIS — N926 Irregular menstruation, unspecified: Secondary | ICD-10-CM

## 2017-04-07 DIAGNOSIS — R519 Headache, unspecified: Secondary | ICD-10-CM

## 2017-04-07 DIAGNOSIS — G8929 Other chronic pain: Secondary | ICD-10-CM

## 2017-04-07 DIAGNOSIS — N644 Mastodynia: Secondary | ICD-10-CM

## 2017-04-07 DIAGNOSIS — Z23 Encounter for immunization: Secondary | ICD-10-CM | POA: Diagnosis not present

## 2017-04-07 DIAGNOSIS — R112 Nausea with vomiting, unspecified: Secondary | ICD-10-CM

## 2017-04-07 DIAGNOSIS — Z113 Encounter for screening for infections with a predominantly sexual mode of transmission: Secondary | ICD-10-CM | POA: Diagnosis not present

## 2017-04-07 DIAGNOSIS — E785 Hyperlipidemia, unspecified: Secondary | ICD-10-CM

## 2017-04-07 DIAGNOSIS — L304 Erythema intertrigo: Secondary | ICD-10-CM | POA: Diagnosis not present

## 2017-04-07 MED ORDER — ONDANSETRON HCL 4 MG PO TABS: 4.0000 mg | ORAL_TABLET | Freq: Two times a day (BID) | ORAL | 0 refills | Status: AC | PRN

## 2017-04-07 MED ORDER — MICONAZOLE NITRATE 2 % EX POWD: CUTANEOUS | 11 refills | Status: AC | PRN

## 2017-04-07 MED ORDER — CLOTRIMAZOLE 1 % EX CREA: 1.0000 "application " | TOPICAL_CREAM | Freq: Two times a day (BID) | CUTANEOUS | 11 refills | Status: AC

## 2017-04-07 NOTE — Addendum Note (Signed)
Addended by: Warden FillersWRIGHT, Makhai Fulco S on: 04/07/2017 09:24 AM   Modules accepted: Orders

## 2017-04-07 NOTE — Patient Instructions (Signed)
Follow up in 1 month sooner if needed  Labs today  Try zofran for nausea and cream and powder to skin folds   Nausea and Vomiting, Adult Feeling sick to your stomach (nausea) means that your stomach is upset or you feel like you have to throw up (vomit). Feeling more and more sick to your stomach can lead to throwing up. Throwing up happens when food and liquid from your stomach are thrown up and out the mouth. Throwing up can make you feel weak and cause you to get dehydrated. Dehydration can make you tired and thirsty, make you have a dry mouth, and make it so you pee (urinate) less often. Older adults and people with other diseases or a weak defense system (immune system) are at higher risk for dehydration. If you feel sick to your stomach or if you throw up, it is important to follow instructions from your doctor about how to take care of yourself. Follow these instructions at home: Eating and drinking Follow these instructions as told by your doctor:  Take an oral rehydration solution (ORS). This is a drink that is sold at pharmacies and stores.  Drink clear fluids in small amounts as you are able, such as: ? Water. ? Ice chips. ? Diluted fruit juice. ? Low-calorie sports drinks.  Eat bland, easy-to-digest foods in small amounts as you are able, such as: ? Bananas. ? Applesauce. ? Rice. ? Low-fat (lean) meats. ? Toast. ? Crackers.  Avoid fluids that have a lot of sugar or caffeine in them.  Avoid alcohol.  Avoid spicy or fatty foods.  General instructions  Drink enough fluid to keep your pee (urine) clear or pale yellow.  Wash your hands often. If you cannot use soap and water, use hand sanitizer.  Make sure that all people in your home wash their hands well and often.  Take over-the-counter and prescription medicines only as told by your doctor.  Rest at home while you get better.  Watch your condition for any changes.  Breathe slowly and deeply when you feel sick  to your stomach.  Keep all follow-up visits as told by your doctor. This is important. Contact a doctor if:  You have a fever.  You cannot keep fluids down.  Your symptoms get worse.  You have new symptoms.  You feel sick to your stomach for more than two days.  You feel light-headed or dizzy.  You have a headache.  You have muscle cramps. Get help right away if:  You have pain in your chest, neck, arm, or jaw.  You feel very weak or you pass out (faint).  You throw up again and again.  You see blood in your throw-up.  Your throw-up looks like black coffee grounds.  You have bloody or black poop (stools) or poop that look like tar.  You have a very bad headache, a stiff neck, or both.  You have a rash.  You have very bad pain, cramping, or bloating in your belly (abdomen).  You have trouble breathing.  You are breathing very quickly.  Your heart is beating very quickly.  Your skin feels cold and clammy.  You feel confused.  You have pain when you pee.  You have signs of dehydration, such as: ? Dark pee, hardly any pee, or no pee. ? Cracked lips. ? Dry mouth. ? Sunken eyes. ? Sleepiness. ? Weakness. These symptoms may be an emergency. Do not wait to see if the symptoms will go  away. Get medical help right away. Call your local emergency services (911 in the [place] Do not drive yourself to the hospital. This information is not intended to replace advice given to you by your health care provider. Make sure you discuss any questions you have with your health care provider. Document Released: 08/24/2007 Document Revised: 09/25/2015 Document Reviewed: 11/11/2014 Elsevier Interactive Patient Education  2018 [employer].  Celesta Aver Intertrigo is skin irritation or inflammation (dermatitis) that occurs when folds of skin rub together. The irritation can cause a rash and make skin raw and itchy. This condition most commonly occurs in the skin folds of these  areas:  Toes.  Armpits.  Groin.  Belly.  Breasts.  Buttocks.  Intertrigo is not passed from person to person (is not contagious). What are the causes? This condition is caused by heat, moisture, friction, and lack of air circulation. The condition can be made worse by:  Sweat.  Bacteria or a fungus, such as yeast.  What increases the risk? This condition is more likely to occur if you have moisture in your skin folds. It is also more likely to develop in people who:  Have diabetes.  Are overweight.  Are on bed rest.  Live in a warm and moist climate.  Wear splints, braces, or other medical devices.  Are not able to control their bowels or bladder (have incontinence).  What are the signs or symptoms? Symptoms of this condition include:  A pink or red skin rash.  Brown patches on the skin.  Raw or scaly skin.  Itchiness.  A burning feeling.  Bleeding.  Leaking fluid.  A bad smell.  How is this diagnosed? This condition is diagnosed with a medical history and physical exam. You may also have a skin swab to test for bacteria or a fungus, such as yeast. How is this treated? Treatment may include:  Cleaning and drying your skin.  An oral antibiotic medicine or antibiotic skin cream for a bacterial infection.  Antifungal cream or pills for an infection that was caused by a fungus, such as yeast.  Steroid ointment to relieve itchiness and irritation.  Follow these instructions at home:  Keep the affected area clean and dry.  Do not scratch your skin.  Stay in a cool environment as much as possible. Use an air conditioner or fan, if available.  Apply over-the-counter and prescription medicines only as told by your health care provider.  If you were prescribed an antibiotic medicine, use it as told by your health care provider. Do not stop using the antibiotic even if your condition improves.  Keep all follow-up visits as told by your health care  provider. This is important. How is this prevented?  Maintain a healthy weight.  Take care of your feet, especially if you have diabetes. Foot care includes: ? Wearing shoes that fit well. ? Keeping your feet dry. ? Wearing clean, breathable socks.  Protect the skin around your groin and buttocks, especially if you have incontinence. Skin protection includes: ? Following a regular cleaning routine. ? Using moisturizers and skin protectants. ? Changing protection pads frequently.  Do not wear tight clothes. Wear clothes that are loose and absorbent. Wear clothes that are made of cotton.  Wear a bra that gives good support, if needed.  Shower and dry yourself thoroughly after activity. Use a hair dryer on a cool setting to dry between skin folds, especially after you bathe.  If you have diabetes, keep your blood sugar under  control. Contact a health care provider if:  Your symptoms do not improve with treatment.  Your symptoms get worse or they spread.  You notice increased redness and warmth.  You have a fever. This information is not intended to replace advice given to you by your health care provider. Make sure you discuss any questions you have with your health care provider. Document Released: 03/07/2005 Document Revised: 08/13/2015 Document Reviewed: 09/08/2014 Elsevier Interactive Patient Education  2018 [employer].

## 2017-04-07 NOTE — Progress Notes (Signed)
Chief Complaint  Beth Morales presents with  . Follow-up    bariatric surgery clearance   F/u  1. Surgical clearance for bariatric surgery not sch yet but will be with Dr. [place] Rex having duodenal swtich she has been obese for a while and years ago she was in her 39s. She reports over 6 months she is down 16 lbs though our scales show since 09/07/16 down from 359 to 354. 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 She often skips meals esp breakfast and will eat 1x per day. Does not drink soda but does drink juice like Hi C apple juice. She is eating healthier with baked chicken and rice and at times has spaggetti at times and sometimes candy 2. C/o skin irritation in skin folds and painful/burning skin under breasts esp  3. She c/o intermittent right frontal h/a mild today she has tried Tylenol w/o relief and only thing that helps is BC Powder. She just had her eyes checked and nl except nearsighted.  Previous sleep study 2018 negative OSA  4. She c/o breast tenderness n/v esp with tomatoe based produced and abnormal cycle missed cycle nov 2018 and Dec cycle last 18 days and Jan had from 3rd to 12th and OB/GYN put her on medication to make her cycle start. Urine preg 03/07/17 with OB/GYN neg.    Review of Systems  Constitutional: Positive for weight loss.  HENT: Negative for hearing loss.   Eyes:       No vision problems   Respiratory: Negative for shortness of breath.   Cardiovascular: Negative for chest pain and leg swelling.       +breast tenderness   Gastrointestinal: Positive for nausea and vomiting. Negative for abdominal pain.  Musculoskeletal: Negative for falls.  Skin: Positive for rash.  Neurological: Positive for headaches.  Psychiatric/Behavioral: Negative for depression and memory loss.   Past Medical History:  Diagnosis Date  . Elevated BP without diagnosis of hypertension   . Esophagitis    EGD 8.2018   . Headache   . Hiatal hernia   .  Morbid obesity with BMI of 50.0-59.9, adult [facility])    Past Surgical History:  Procedure Laterality Date  . NO PAST SURGERIES     Family History  Problem Relation Age of Onset  . Diabetes Mother   . Alcohol abuse Father   . Diabetes Father   . Breast cancer Maternal Grandmother    Social History   Socioeconomic History  . Marital status: Single    Spouse name: Not on file  . Number of children: Not on file  . Years of education: Not on file  . Highest education level: Not on file  Social Needs  . Financial resource strain: Not on file  . Food insecurity - worry: Not on file  . Food insecurity - inability: Not on file  . Transportation needs - medical: Not on file  . Transportation needs - non-medical: Not on file  Occupational History  . Not on file  Tobacco Use  . Smoking status: Never Smoker  . Smokeless tobacco: Never Used  Substance and Sexual Activity  . Alcohol use: Yes  . Drug use: No  . Sexual activity: Yes    Birth control/protection: None    Comment: tried Nexplanon but had DUB x 2 years, could not rememeber to take pills   Other Topics Concern  . Not on file  Social History Narrative   Used to be social  worker    Current Meds  Medication Sig  . Aspirin-Caffeine 845-65 MG PACK Take by mouth as needed (BC Powder).   . pantoprazole (PROTONIX) 40 MG tablet Take 40 mg by mouth daily.   Allergies  Allergen Reactions  . Fish-Derived Products Itching   Recent Results (from the past 2160 hour(s))  POCT urine pregnancy     Status: Normal   Collection Time: 03/07/17  9:57 AM  Result Value Ref Range   Preg Test, Ur Negative Negative  Chlamydia/Gonococcus/Trichomonas, NAA     Status: None   Collection Time: 03/07/17  4:17 PM  Result Value Ref Range   Chlamydia by NAA Negative Negative   Gonococcus by NAA Negative Negative   Trich vag by NAA Negative Negative   Objective  Body mass index is 55.52 kg/m. Wt Readings from Last 3 Encounters:  04/07/17 (!)  354 lb 8 oz (160.8 kg)  03/07/17 (!) 354 lb (160.6 kg)  10/10/16 (!) 354 lb 6 oz (160.7 kg)   Temp Readings from Last 3 Encounters:  04/07/17 98.2 F (36.8 C) (Oral)  10/10/16 98.5 F (36.9 C) (Oral)  09/07/16 98.5 F (36.9 C) (Oral)   BP Readings from Last 3 Encounters:  04/07/17 118/78  03/07/17 130/90  10/10/16 118/84   Pulse Readings from Last 3 Encounters:  04/07/17 98  03/07/17 79  10/10/16 (!) 52   O2 sat 97% room air   Physical Exam  Constitutional: She is oriented to person, place, and time and well-developed, well-nourished, and in no distress. Vital signs are normal.  HENT:  Head: Normocephalic and atraumatic.  Mouth/Throat: Oropharynx is clear and moist and mucous membranes are normal.  Eyes: Conjunctivae are normal. Pupils are equal, round, and reactive to light.  Cardiovascular: Normal rate, regular rhythm and normal heart sounds.  Pulmonary/Chest: Effort normal and breath sounds normal.  Abdominal: Soft. Bowel sounds are normal. There is no tenderness.  Neurological: She is alert and oriented to person, place, and time. Gait normal. Gait normal.  Skin: Skin is warm and dry.     Intertrigo skin folds esp left breast   Psychiatric: Mood, memory, affect and judgment normal.  Nursing note and vitals reviewed.   Assessment   1. Obesity with some wt loss BMI 55.52 with h/o A1C 5.9 09/07/16. Pending Duodenal switch 2. H/a neg OSA eval for migraine at f/u prev sleep study neg.  3. Abnormal menses and tender breasts  4. Nausea and vomiting intermittent and at times assoc with h/a  5. HM 6. intertrigo Plan  1.  Pending duodenal switch Rex see HPI EKG today normal low cardiac risk score index/surgical risk  Filled out forms for surgery today  2. rec not use BC powder Will eval at f/u   3 Check serum hcg and other stds having unprotected sex GC/C, trich HIV neg and urine preg neg 03/07/17  4.  Prn Zofran  5. Given flu shot today  UTD Tdap  Had HPV  vaccines   Never smoker  Need to get copy of pap   Check RPR, hep B/C, HSV, lipid, UA, serum HCG Gc/c/trich, HIV neg 03/07/17. Having unprotected sex no birth control method rec safe sex    6. Use clotrimazole 7-10 days bid then zeasorb AF for maintenance    Provider: Dr. French Anaracy McLean-Scocuzza-Internal Medicine

## 2017-04-08 LAB — URINALYSIS, ROUTINE W REFLEX MICROSCOPIC
Bilirubin, UA: NEGATIVE
Glucose, UA: NEGATIVE
KETONES UA: NEGATIVE
LEUKOCYTES UA: NEGATIVE
Nitrite, UA: NEGATIVE
PROTEIN UA: NEGATIVE
RBC, UA: NEGATIVE
Urobilinogen, Ur: 1 mg/dL (ref 0.2–1.0)
pH, UA: 6 (ref 5.0–7.5)

## 2017-04-08 LAB — HSV 2 ANTIBODY, IGG: HSV 2 IgG, Type Spec: <0.91 index (ref 0.00–0.90)

## 2017-04-08 LAB — HEPATITIS B SURFACE ANTIGEN: Hepatitis B Surface Ag: NEGATIVE

## 2017-04-08 LAB — LIPID PANEL
CHOLESTEROL TOTAL: 204 mg/dL — AB (ref 100–199)
Chol/HDL Ratio: 4.6 ratio — ABNORMAL HIGH (ref 0.0–4.4)
HDL: 44 mg/dL (ref >39)
LDL CALC: 138 mg/dL — AB (ref 0–99)
TRIGLYCERIDES: 110 mg/dL (ref 0–149)
VLDL Cholesterol Cal: 22 mg/dL (ref 5–40)

## 2017-04-08 LAB — HEPATITIS B SURFACE ANTIBODY,QUALITATIVE: Hep B Surface Ab, Qual: NONREACTIVE

## 2017-04-08 LAB — BETA HCG QUANT (REF LAB): hCG Quant: <1 m[IU]/mL

## 2017-04-08 LAB — HSV 1 ANTIBODY, IGG: HSV 1 Glycoprotein G Ab, IgG: 40.3 index — ABNORMAL HIGH (ref 0.00–0.90)

## 2017-04-08 LAB — HEPATITIS C ANTIBODY

## 2017-05-09 ENCOUNTER — Ambulatory Visit: Payer: Self-pay | Admitting: Internal Medicine

## 2017-05-09 DIAGNOSIS — Z0289 Encounter for other administrative examinations: Secondary | ICD-10-CM

## 2017-06-06 ENCOUNTER — Emergency Department
Admission: EM | Admit: 2017-06-06 | Discharge: 2017-06-06 | Disposition: A | Discharge status: Home / Self Care | Payer: Managed Care, Other (non HMO) | Attending: Emergency Medicine | Admitting: Emergency Medicine

## 2017-06-06 ENCOUNTER — Other Ambulatory Visit: Payer: Self-pay

## 2017-06-06 ENCOUNTER — Encounter: Payer: Self-pay | Admitting: Emergency Medicine

## 2017-06-06 DIAGNOSIS — R102 Pelvic and perineal pain: Secondary | ICD-10-CM | POA: Diagnosis present

## 2017-06-06 DIAGNOSIS — Z79899 Other long term (current) drug therapy: Secondary | ICD-10-CM | POA: Insufficient documentation

## 2017-06-06 DIAGNOSIS — N760 Acute vaginitis: Secondary | ICD-10-CM | POA: Diagnosis not present

## 2017-06-06 DIAGNOSIS — B9689 Other specified bacterial agents as the cause of diseases classified elsewhere: Secondary | ICD-10-CM

## 2017-06-06 DIAGNOSIS — F419 Anxiety disorder, unspecified: Secondary | ICD-10-CM | POA: Insufficient documentation

## 2017-06-06 LAB — URINALYSIS, COMPLETE (UACMP) WITH MICROSCOPIC
BACTERIA UA: NONE SEEN
Bilirubin Urine: NEGATIVE
Glucose, UA: NEGATIVE mg/dL
HGB URINE DIPSTICK: NEGATIVE
Ketones, ur: NEGATIVE mg/dL
Leukocytes, UA: NEGATIVE
NITRITE: NEGATIVE
PROTEIN: NEGATIVE mg/dL
SPECIFIC GRAVITY, URINE: 1.031 — AB (ref 1.005–1.030)
pH: 5 (ref 5.0–8.0)

## 2017-06-06 LAB — WET PREP, GENITAL
SPERM: NONE SEEN
Trich, Wet Prep: NONE SEEN
YEAST WET PREP: NONE SEEN

## 2017-06-06 LAB — CHLAMYDIA/NGC RT PCR (ARMC ONLY)
CHLAMYDIA TR: NOT DETECTED
N gonorrhoeae: NOT DETECTED

## 2017-06-06 LAB — POCT PREGNANCY, URINE: PREG TEST UR: NEGATIVE

## 2017-06-06 LAB — HCG, QUANTITATIVE, PREGNANCY: hCG, Beta Chain, Quant, S: <1 m[IU]/mL (ref <5)

## 2017-06-06 MED ORDER — METRONIDAZOLE 500 MG PO TABS: 500.0000 mg | ORAL_TABLET | Freq: Three times a day (TID) | ORAL | 0 refills | Status: AC

## 2017-06-06 NOTE — ED Triage Notes (Signed)
States that she has had some cramping in her lower pelvic area. States that she has taken urine preg test and they came back negative, and thinks that she may need a blood test for pregnancy since she hasnt had period since Jan 1st.

## 2017-06-06 NOTE — ED Notes (Signed)
Pt ambulatory upon discharge. Verbalized understanding of discharge instructions, follow-up care and prescription. VSS. Skin warm and dry. A&O x4.  

## 2017-06-06 NOTE — Discharge Instructions (Signed)
Follow up with your regular doctor if not better in 3 days, or the health department if needed.  Use the medication as prescribed.  Return to the ER if worsening

## 2017-06-06 NOTE — ED Provider Notes (Addendum)
[facility] Emergency Department Provider Note  ____________________________________________   First MD Initiated Contact with Patient 06/06/17 1816     (approximate)  I have reviewed the triage vital signs and the nursing notes.   HISTORY  Chief Complaint Abdominal Cramping    HPI Beth Morales is a 22 y.o. female presents to the emergency department stating that she is got some cramping in her lower pelvis.  She took a home pregnancy test which came back negative.  She states she is unsure what feels more pain over in the sides than in the center.  She does have some creamy vaginal discharge.  Her last menstrual period was at the end of January.  She denies any fever or chills.  She denies abdominal pain, vomiting, diarrhea.  She states she did have a Pap smear last year and it was normal  Past Medical History:  Diagnosis Date  . Elevated BP without diagnosis of hypertension   . Esophagitis    EGD 8.2018   . Headache   . Hiatal hernia   . Morbid obesity with BMI of 50.0-59.9, adult [facility])     Patient Active Problem List   Diagnosis Date Noted  . Morbid obesity (HCC) 04/07/2017  . Prediabetes 04/07/2017  . Hyperlipidemia 04/07/2017  . Abnormal menses 04/07/2017  . Breast tenderness in female 04/07/2017  . Nausea and vomiting 04/07/2017  . Anxiety 10/10/2016  . Annual physical exam 09/07/2016  . Elevated BP without diagnosis of hypertension   . Morbid obesity with BMI of 50.0-59.9, adult [facility])     Past Surgical History:  Procedure Laterality Date  . NO PAST SURGERIES      Prior to Admission medications   Medication Sig Start Date End Date Taking? Authorizing Provider  Aspirin-Caffeine 845-65 MG PACK Take by mouth as needed (BC Powder).     [provider]  clotrimazole (LOTRIMIN) 1 % cream Apply 1 application topically 2 (two) times daily. Bid to skin folds 1 week to 10 days as needed. 04/07/17   McLean-Scocuzza, Pasty Spillers, MD    metroNIDAZOLE (FLAGYL) 500 MG tablet Take 1 tablet (500 mg total) by mouth 3 (three) times daily. 06/06/17   Kaydan Wong, Roselyn Bering, PA-C  miconazole (ZEASORB-AF) 2 % powder Apply topically as needed for itching. And to keep you dry Do not use in private area 04/07/17   McLean-Scocuzza, Pasty Spillers, MD  ondansetron (ZOFRAN) 4 MG tablet Take 1 tablet (4 mg total) by mouth 2 (two) times daily as needed for nausea or vomiting. 04/07/17   McLean-Scocuzza, Pasty Spillers, MD  pantoprazole (PROTONIX) 40 MG tablet Take 40 mg by mouth daily. 03/10/17   [provider]    Allergies Pantoprazole sodium and Fish-derived products  Family History  Problem Relation Age of Onset  . Diabetes Mother   . Alcohol abuse Father   . Diabetes Father   . Breast cancer Maternal Grandmother     Social History Social History   Tobacco Use  . Smoking status: Never Smoker  . Smokeless tobacco: Never Used  Substance Use Topics  . Alcohol use: Yes  . Drug use: No    Review of Systems  Constitutional: No fever/chills Eyes: No visual changes. ENT: No sore throat. Respiratory: Denies cough Genitourinary: Negative for dysuria.  Positive for vaginal discharge and pelvic pain Musculoskeletal: Negative for back pain. Skin: Negative for rash.    ____________________________________________   PHYSICAL EXAM:  VITAL SIGNS: ED Triage Vitals  Enc Vitals Group  BP 06/06/17 1736 (!) 152/87     Pulse Rate 06/06/17 1736 93     Resp 06/06/17 1736 20     Temp 06/06/17 1736 98.8 F (37.1 C)     Temp Source 06/06/17 1736 Oral     SpO2 06/06/17 1736 98 %     Weight 06/06/17 1739 (!) 347 lb (157.4 kg)     Height 06/06/17 1739 5\' 8"  (1.727 m)     Head Circumference --      Peak Flow --      Pain Score 06/06/17 1738 2     Pain Loc --      Pain Edu? --      Excl. in GC? --     Constitutional: Alert and oriented. Well appearing and in no acute distress. Eyes: Conjunctivae are normal.  Head: Atraumatic. Nose: No  congestion/rhinnorhea. Mouth/Throat: Mucous membranes are moist.   Cardiovascular: Normal rate, regular rhythm.  Heart sounds are normal Respiratory: Normal respiratory effort.  No retractions, lungs are clear to auscultation GU: The external vaginal exam shows some vaginal discharge.  There are no herpetic lesions.  The cervix is smooth and round with questionable blood noted in the center.  There is a clear liquidy discharge from the same area.  Labs were obtained for GC/committee Musculoskeletal: FROM all extremities, warm and well perfused Neurologic:  Normal speech and language.  Skin:  Skin is warm, dry and intact. No rash noted. Psychiatric: Mood and affect are normal. Speech and behavior are normal.  ____________________________________________   LABS (all labs ordered are listed, but only abnormal results are displayed)  Labs Reviewed  WET PREP, GENITAL - Abnormal; Notable for the following components:      Result Value   Clue Cells Wet Prep HPF POC PRESENT (*)    WBC, Wet Prep HPF POC FEW (*)    All other components within normal limits  URINALYSIS, COMPLETE (UACMP) WITH MICROSCOPIC - Abnormal; Notable for the following components:   Color, Urine YELLOW (*)    APPearance HAZY (*)    Specific Gravity, Urine 1.031 (*)    Squamous Epithelial / LPF 0-5 (*)    All other components within normal limits  CHLAMYDIA/NGC RT PCR ([facility] ONLY)  HCG, QUANTITATIVE, PREGNANCY  POC URINE PREG, ED  POCT PREGNANCY, URINE   ____________________________________________   ____________________________________________  RADIOLOGY    ____________________________________________   PROCEDURES  Procedure(s) performed: No  Procedures    ____________________________________________   INITIAL IMPRESSION / ASSESSMENT AND PLAN / ED COURSE  Pertinent labs & imaging results that were available during my care of the patient were reviewed by me and considered in my medical  decision making (see chart for details).  Patient is 22 year old female presents emergency department complaining of lower pelvic pain along with a LMP in January.  She took home pregnancy tests that were negative.  She would like to get a blood test.  She states she does have vaginal discharge.  On physical exam the patient is obese, she appears well.  The vaginal exam shows a fair amount of a cream to clear colored vaginal discharge.  The cervical os has a dark area like a blood clot with clear liquid draining from the same area.  Wet prep, GC chlamydia, urine pregnant, beta  Wet prep is positive for white blood cells and clue cells.  GC chlamydia are negative.  Beta hCG is less than 1  Test results were explained to the patient.  She is to  follow-up with her regular doctor or the health department as needed.  Due to the amenorrhea, she should discuss this with her regular doctor.  She was given a prescription for Flagyl 500 mg 3 times a day for 7 days.  She is to stop douching.  She should use an unscented soap in the vaginal area also.  She is to drink plenty of fluids.  She is to follow-up with the health department if needed.  She was discharged in stable condition     As part of my medical decision making, I reviewed the following data within the electronic MEDICAL RECORD NUMBER Nursing notes reviewed and incorporated, Labs reviewed wet prep positive clue cells and white blood cells, chlamydia/gonorrhea is negative, pregnancy test is negative, Notes from prior ED visits and [place] Controlled Substance Database  ____________________________________________   FINAL CLINICAL IMPRESSION(S) / ED DIAGNOSES  Final diagnoses:  BV (bacterial vaginosis)      NEW MEDICATIONS STARTED DURING THIS VISIT:  New Prescriptions   METRONIDAZOLE (FLAGYL) 500 MG TABLET    Take 1 tablet (500 mg total) by mouth 3 (three) times daily.     Note:  This document was prepared using Dragon voice recognition  software and may include unintentional dictation errors.    Faythe GheeFisher, Sierria Bruney W, PA-C 06/06/17 2042    Faythe GheeFisher, Dionicio Shelnutt W, PA-C 06/06/17 2105    Dionne BucySiadecki, Sebastian, MD 06/06/17 2222

## 2017-06-06 NOTE — ED Notes (Signed)
ED Provider at bedside. 

## 2017-06-06 NOTE — ED Notes (Signed)
Pt requests pregnancy test

## 2021-06-03 ENCOUNTER — Telehealth: Payer: Self-pay | Admitting: Internal Medicine

## 2021-06-03 NOTE — Telephone Encounter (Signed)
Called to schedule appointment patient has not seen PCP since 2019 and patient does have the current numbers listed after three years we can remove PCP. Please advise what you would like. ?

## 2021-06-03 NOTE — Telephone Encounter (Signed)
We can mail letter and remove pt for now thank you ?
